# Patient Record
Sex: Female | Born: 1970 | Hispanic: No | Marital: Married | Smoking: Never smoker
Health system: Southern US, Community
[De-identification: ages and names within clinical notes are randomized; demographics above are authoritative.]

## PROBLEM LIST (undated history)

## (undated) DIAGNOSIS — E119 Type 2 diabetes mellitus without complications: Secondary | ICD-10-CM

## (undated) HISTORY — DX: Type 2 diabetes mellitus without complications: E11.9

---

## 1999-07-27 ENCOUNTER — Other Ambulatory Visit: Admission: RE | Admit: 1999-07-27 | Discharge: 1999-07-27 | Payer: Self-pay

## 2020-01-09 ENCOUNTER — Emergency Department: Payer: BC Managed Care – PPO

## 2020-01-09 ENCOUNTER — Other Ambulatory Visit: Payer: Self-pay

## 2020-01-09 ENCOUNTER — Emergency Department
Admission: EM | Admit: 2020-01-09 | Discharge: 2020-01-09 | Disposition: A | Discharge status: Home / Self Care | Payer: BC Managed Care – PPO | Attending: Student in an Organized Health Care Education/Training Program | Admitting: Student in an Organized Health Care Education/Training Program

## 2020-01-09 ENCOUNTER — Encounter: Payer: Self-pay | Admitting: Emergency Medicine

## 2020-01-09 DIAGNOSIS — M79603 Pain in arm, unspecified: Secondary | ICD-10-CM

## 2020-01-09 DIAGNOSIS — Y999 Unspecified external cause status: Secondary | ICD-10-CM | POA: Insufficient documentation

## 2020-01-09 DIAGNOSIS — M79621 Pain in right upper arm: Secondary | ICD-10-CM | POA: Insufficient documentation

## 2020-01-09 DIAGNOSIS — Y92410 Unspecified street and highway as the place of occurrence of the external cause: Secondary | ICD-10-CM | POA: Diagnosis not present

## 2020-01-09 DIAGNOSIS — Y939 Activity, unspecified: Secondary | ICD-10-CM | POA: Insufficient documentation

## 2020-01-09 MED ORDER — BACLOFEN 5 MG PO TABS: 5.0000 mg | ORAL_TABLET | Freq: Three times a day (TID) | ORAL | 0 refills | Status: AC | PRN
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
## 2021-05-20 ENCOUNTER — Ambulatory Visit (INDEPENDENT_AMBULATORY_CARE_PROVIDER_SITE_OTHER): Payer: BC Managed Care – PPO

## 2021-05-20 ENCOUNTER — Other Ambulatory Visit: Payer: Self-pay

## 2021-05-20 ENCOUNTER — Ambulatory Visit
Admission: EM | Admit: 2021-05-20 | Discharge: 2021-05-20 | Disposition: A | Discharge status: Home / Self Care | Payer: BC Managed Care – PPO

## 2021-05-20 DIAGNOSIS — R0981 Nasal congestion: Secondary | ICD-10-CM | POA: Diagnosis not present

## 2021-05-20 DIAGNOSIS — R051 Acute cough: Secondary | ICD-10-CM

## 2021-05-20 DIAGNOSIS — R059 Cough, unspecified: Secondary | ICD-10-CM | POA: Diagnosis not present

## 2021-05-20 DIAGNOSIS — J209 Acute bronchitis, unspecified: Secondary | ICD-10-CM | POA: Diagnosis not present

## 2021-05-20 DIAGNOSIS — R0602 Shortness of breath: Secondary | ICD-10-CM

## 2021-05-20 MED ORDER — BENZONATATE 200 MG PO CAPS: 200.0000 mg | ORAL_CAPSULE | Freq: Three times a day (TID) | ORAL | 0 refills | Status: AC | PRN

## 2021-05-20 MED ORDER — AZITHROMYCIN 250 MG PO TABS: 250.0000 mg | ORAL_TABLET | Freq: Every day | ORAL | 0 refills | Status: AC

## 2021-05-20 NOTE — ED Provider Notes (Signed)
[facility]    CSN: [id number] Arrival date & time: 05/20/21  1733      History   Chief Complaint Chief Complaint  Patient presents with   Nasal Congestion   Cough    HPI Kristen Koch is a 50 y.o. female presenting for approximately 2-week history of nasal congestion, headaches, fatigue and cough.  Patient reports over the past 4 days or so she has started to have some pressure in her chest and occasionally feels short of breath.  Patient works as a Curator.  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  Patient did go to [facility] ED when symptoms started.  She was negative for COVID-19.  She denies any fevers other than the first day when it was up to 102 degrees.  She denies sinus pain or ear pain.  No vomiting or diarrhea.  Has been taking over-the-counter Mucinex and staying hydrated.  Patient reports she cannot take a lot of different cough medicines because they raise her blood sugar.  Patient says her diabetes is under fairly good control.  She says it has occasionally gone up to 400 since being sick and taking some over-the-counter cough medicines.  She has no other complaints.  HPI  Past Medical History:  Diagnosis Date   Diabetes mellitus without complication (HCC)     There are no problems to display for this patient.   No past surgical history on file.  OB History   No obstetric history on file.      Home Medications    Prior to Admission medications   Medication Sig Start Date End Date Taking? Authorizing Provider  azithromycin (ZITHROMAX) 250 MG tablet Take 1 tablet (250 mg total) by mouth daily. Take first 2 tablets together, then 1 every day until finished. 05/20/21  Yes Eusebio Friendly B, PA-C  benzonatate (TESSALON) 200 MG capsule Take 1 capsule (200 mg total) by mouth 3 (three) times daily as needed for up to 10 days for cough. 05/20/21 05/30/21 Yes Eusebio Friendly B, PA-C   darunavir-cobicistat (PREZCOBIX) 800-150 MG tablet Take 1 tablet by mouth daily with breakfast. Swallow whole. Do NOT crush, break or chew tablets. Take with food.   Yes [provider]  Dulaglutide (TRULICITY) 0.75 MG/0.5ML SOPN Inject into the skin. 02/23/21  Yes [provider]  emtricitabine-tenofovir AF (DESCOVY) 200-25 MG tablet Take 1 tablet by mouth daily.   Yes [provider]  lisinopril (ZESTRIL) 5 MG tablet Take 5 mg by mouth daily. 02/23/21  Yes [provider]  metFORMIN (GLUCOPHAGE-XR) 500 MG 24 hr tablet Take 2 tablets by mouth 2 (two) times daily. 01/19/21  Yes [provider]  Baclofen 5 MG TABS Take 5 mg by mouth 3 (three) times daily as needed. 01/09/20   Enid Derry, PA-C    Family History No family history on file.  Social History Social History   Tobacco Use   Smoking status: Never   Smokeless tobacco: Never     Allergies   Patient has no known allergies.   Review of Systems Review of Systems  Constitutional:  Positive for fatigue. Negative for chills, diaphoresis and fever.  HENT:  Positive for congestion. Negative for ear pain, rhinorrhea, sinus pressure, sinus pain and sore throat.   Respiratory:  Positive for cough and shortness of breath.   Cardiovascular:  Positive for chest pain (chest pressure).  Gastrointestinal:  Negative for abdominal pain, nausea  and vomiting.  Musculoskeletal:  Negative for arthralgias and myalgias.  Skin:  Negative for rash.  Neurological:  Positive for headaches. Negative for weakness.  Hematological:  Negative for adenopathy.    Physical Exam Triage Vital Signs ED Triage Vitals [05/20/21 1853]  Enc Vitals Group     BP (!) 151/80     Pulse Rate 78     Resp 16     Temp 97.7 F (36.5 C)     Temp Source Oral     SpO2 96 %     Weight      Height      Head Circumference      Peak Flow      Pain Score      Pain Loc      Pain Edu?      Excl. in GC?    No data  found.  Updated Vital Signs BP (!) 151/80 (BP Location: Left Arm)   Pulse 78   Temp 97.7 F (36.5 C) (Oral)   Resp 16   SpO2 96%      Physical Exam Vitals and nursing note reviewed.  Constitutional:      General: She is not in acute distress.    Appearance: Normal appearance. She is not ill-appearing or toxic-appearing.  HENT:     Head: Normocephalic and atraumatic.     Nose: Congestion present.     Mouth/Throat:     Mouth: Mucous membranes are moist.     Pharynx: Oropharynx is clear.  Eyes:     General: No scleral icterus.       Right eye: No discharge.        Left eye: No discharge.     Conjunctiva/sclera: Conjunctivae normal.  Cardiovascular:     Rate and Rhythm: Normal rate and regular rhythm.     Heart sounds: Normal heart sounds.  Pulmonary:     Effort: Pulmonary effort is normal. No respiratory distress.     Breath sounds: Normal breath sounds. No wheezing, rhonchi or rales.  Musculoskeletal:     Cervical back: Neck supple.  Skin:    General: Skin is dry.  Neurological:     General: No focal deficit present.     Mental Status: She is alert. Mental status is at baseline.     Motor: No weakness.     Gait: Gait normal.  Psychiatric:        Mood and Affect: Mood normal.        Behavior: Behavior normal.        Thought Content: Thought content normal.     UC Treatments / Results  Labs (all labs ordered are listed, but only abnormal results are displayed) Labs Reviewed - No data to display  EKG   Radiology DG Chest 2 View  Result Date: 05/20/2021 CLINICAL DATA:  Cough and shortness of breath.  Congestion. EXAM: CHEST - 2 VIEW COMPARISON:  None. FINDINGS: The cardiomediastinal contours are normal. The lungs are clear. Pulmonary vasculature is normal. No consolidation, pleural effusion, or pneumothorax. No acute osseous abnormalities are seen. IMPRESSION: No acute chest findings.  No evidence of pneumonia. Electronically Signed   By: Narda Rutherford M.D.    On: 05/20/2021 19:20    Procedures Procedures (including critical care time)  Medications Ordered in UC Medications - No data to display  Initial Impression / Assessment and Plan / UC Course  I have reviewed the triage vital signs and the nursing notes.  Pertinent labs & imaging  results that were available during my care of the patient were reviewed by me and considered in my medical decision making (see chart for details).   50 y/o female presenting for 2-week history of cough, nasal congestion, and fatigue.  New onset chest pressure and shortness of breath over the past 4 days.  Patient is afebrile and overall well-appearing.  She does have nasal congestion on exam.  Chest clear to auscultation.  Chest x-ray obtained.  No evidence of pneumonia.  Discussed with patient.  Advised patient symptoms consistent with viral bronchitis.  Supportive care advised.  Advised to increase rest and fluids.  Continue Mucinex.  Sent benzonatate to pharmacy.  I printed prescription for azithromycin in case she is not feeling better over the next week she can start that to cover for any bacterial causes.  Reviewed return and ED precautions.   Final Clinical Impressions(s) / UC Diagnoses   Final diagnoses:  Acute bronchitis, unspecified organism  Acute cough  Nasal congestion     Discharge Instructions      -Your chest x-ray does not show any evidence of pneumonia. - You have a chest cold.  These can hang on for a few weeks sometimes. - Increase rest and fluid intake.  I have sent a cough medication for you. - If you want take Mucinex you can. - Tylenol for chest discomfort if desired. - I have printed a prescription for an antibiotic in case you not feeling better over the next week or you start to feel worse. - If you develop a fever, increased chest pain or breathing difficulty he should be seen again immediately.     ED Prescriptions     Medication Sig Dispense Auth. Provider    benzonatate (TESSALON) 200 MG capsule Take 1 capsule (200 mg total) by mouth 3 (three) times daily as needed for up to 10 days for cough. 30 capsule Eusebio Friendly B, PA-C   azithromycin (ZITHROMAX) 250 MG tablet Take 1 tablet (250 mg total) by mouth daily. Take first 2 tablets together, then 1 every day until finished. 6 tablet Gareth Morgan      PDMP not reviewed this encounter.   Shirlee Latch, PA-C 05/20/21 1938

## 2021-05-20 NOTE — Discharge Instructions (Addendum)
-  Your chest x-ray does not show any evidence of pneumonia. - You have a chest cold.  These can hang on for a few weeks sometimes. - Increase rest and fluid intake.  I have sent a cough medication for you. - If you want take Mucinex you can. - Tylenol for chest discomfort if desired. - I have printed a prescription for an antibiotic in case you not feeling better over the next week or you start to feel worse. - If you develop a fever, increased chest pain or breathing difficulty he should be seen again immediately.

## 2021-05-20 NOTE — ED Triage Notes (Signed)
Pt c/o congestion, headache, sxs has been going on for 2 weeks. SOB started 4 days ago. Pt tried medication to help but is diabetic and made her glucose up.

## 2023-05-10 IMAGING — CR DG CHEST 2V
2 series · 2 of 2 positions shown · non-contrast
Comparison: None.

CLINICAL DATA: Cough and shortness of breath.  Congestion.

EXAM:
CHEST - 2 VIEW

[chest pa]
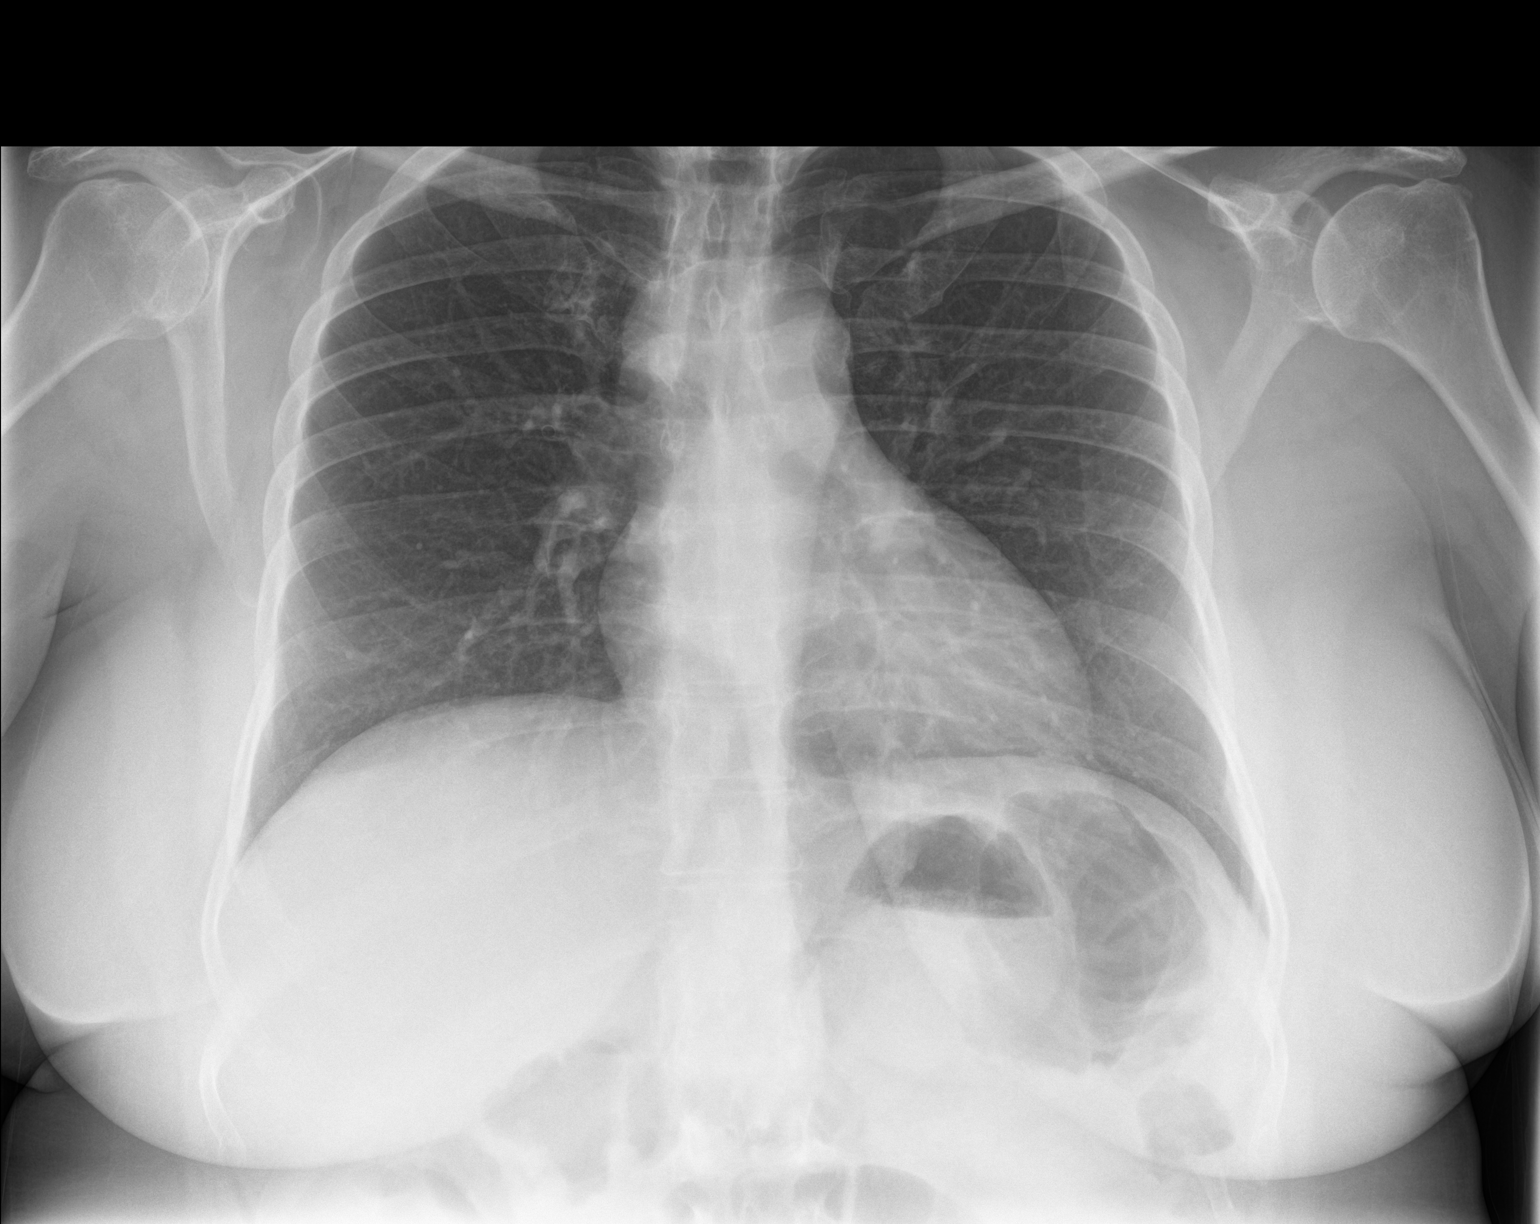

[chest lat]
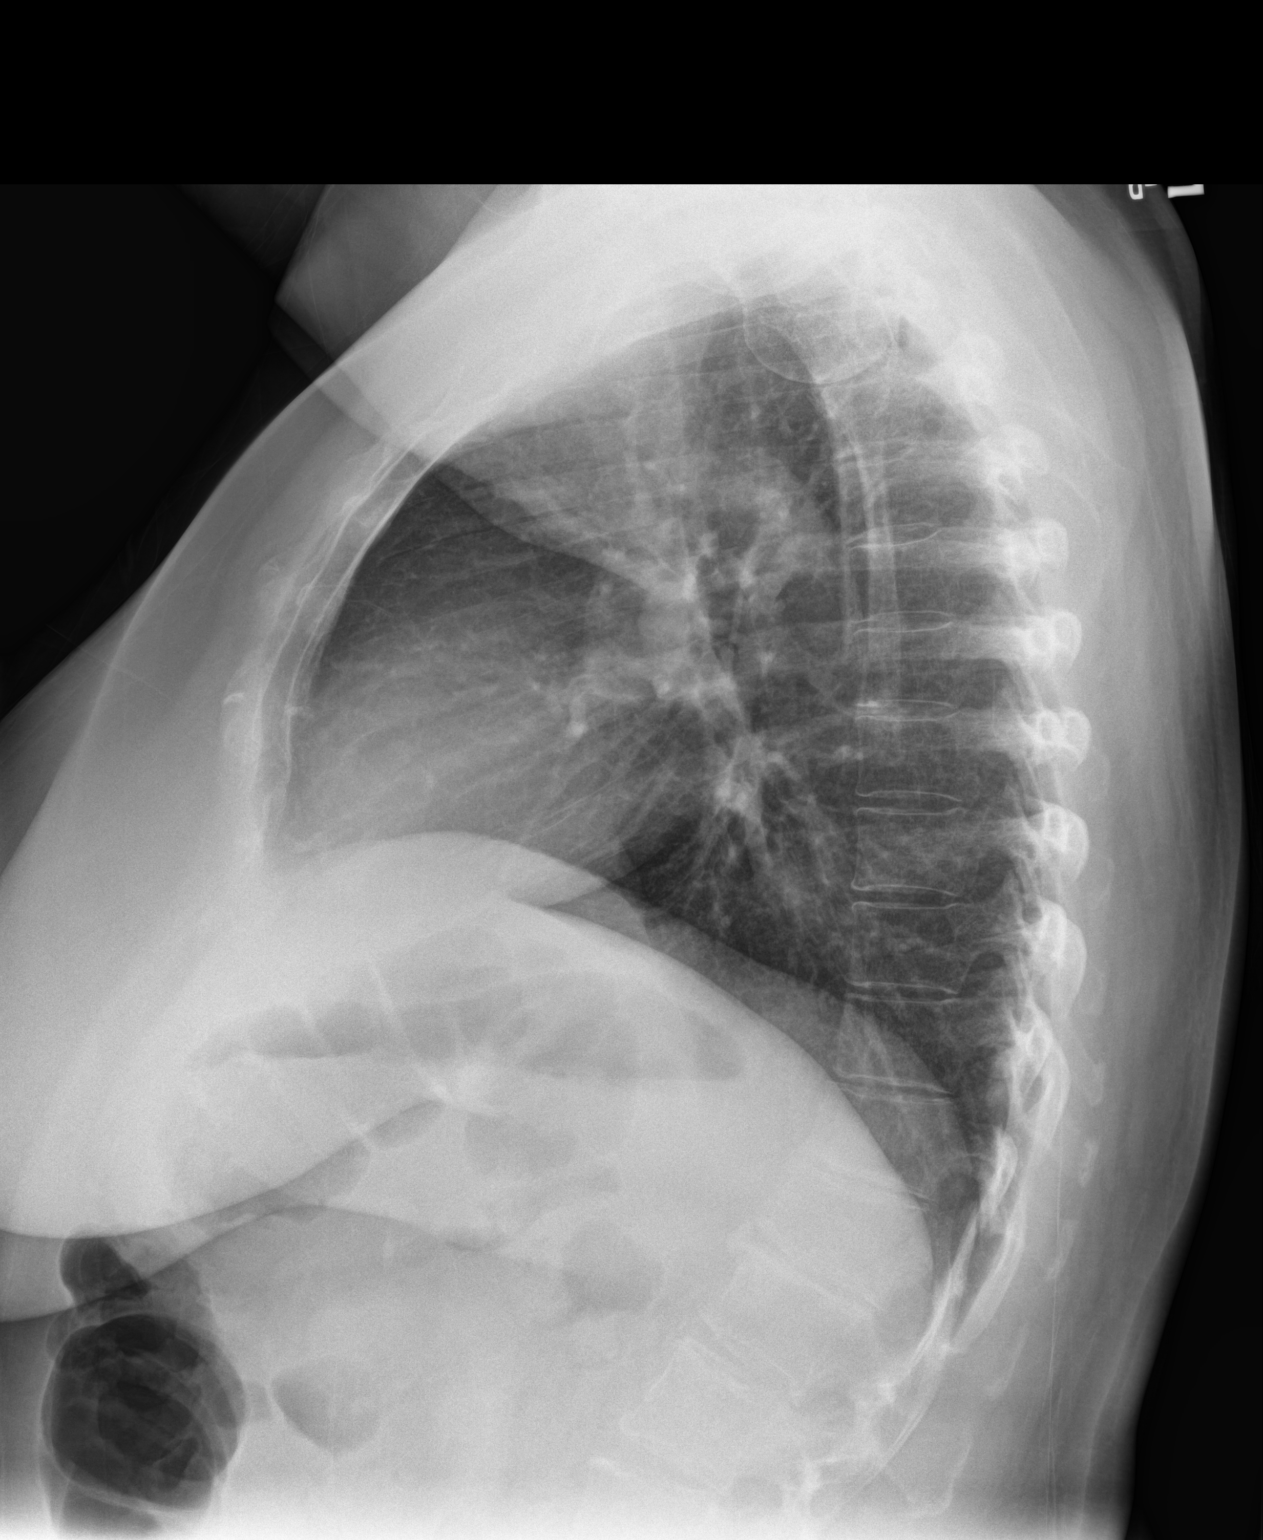

[2 of 2 positions shown; findings below may reference images not displayed]

FINDINGS: The cardiomediastinal contours are normal. The lungs are clear.
Pulmonary vasculature is normal. No consolidation, pleural effusion,
or pneumothorax. No acute osseous abnormalities are seen.
IMPRESSION: No acute chest findings.  No evidence of pneumonia.
